# Patient Record
Sex: Female | Born: 1975 | Race: White | Hispanic: No | Marital: Single | Smoking: Never smoker
Health system: Southern US, Community
[De-identification: ages and names within clinical notes are randomized; demographics above are authoritative.]

## PROBLEM LIST (undated history)

## (undated) HISTORY — PX: CHOLECYSTECTOMY: SHX55

## (undated) HISTORY — PX: ABDOMINAL HYSTERECTOMY: SHX81

---

## 2004-12-28 ENCOUNTER — Emergency Department: Payer: Self-pay | Admitting: Emergency Medicine

## 2004-12-28 ENCOUNTER — Other Ambulatory Visit: Payer: Self-pay

## 2005-04-26 ENCOUNTER — Emergency Department: Payer: Self-pay | Admitting: Emergency Medicine

## 2006-01-17 ENCOUNTER — Emergency Department: Payer: Self-pay | Admitting: Emergency Medicine

## 2006-01-18 ENCOUNTER — Ambulatory Visit: Payer: Self-pay | Admitting: Emergency Medicine

## 2006-06-04 ENCOUNTER — Encounter: Admission: RE | Admit: 2006-06-04 | Discharge: 2006-06-04 | Payer: Self-pay | Admitting: Gastroenterology

## 2006-06-05 ENCOUNTER — Encounter: Admission: RE | Admit: 2006-06-05 | Discharge: 2006-06-05 | Payer: Self-pay | Admitting: Gastroenterology

## 2006-12-19 ENCOUNTER — Inpatient Hospital Stay: Payer: Self-pay | Admitting: Surgery

## 2007-08-29 ENCOUNTER — Emergency Department: Payer: Self-pay | Admitting: Emergency Medicine

## 2007-08-30 ENCOUNTER — Other Ambulatory Visit: Payer: Self-pay

## 2007-08-30 ENCOUNTER — Emergency Department: Payer: Self-pay | Admitting: Emergency Medicine

## 2008-01-02 ENCOUNTER — Emergency Department: Payer: Self-pay | Admitting: Emergency Medicine

## 2009-05-20 ENCOUNTER — Emergency Department: Payer: Self-pay | Admitting: Emergency Medicine

## 2009-10-30 ENCOUNTER — Emergency Department: Payer: Self-pay | Admitting: Emergency Medicine

## 2010-07-18 ENCOUNTER — Ambulatory Visit: Payer: Self-pay | Admitting: Gastroenterology

## 2010-11-30 ENCOUNTER — Ambulatory Visit: Payer: Self-pay | Admitting: Obstetrics and Gynecology

## 2010-12-04 LAB — PATHOLOGY REPORT

## 2011-05-25 ENCOUNTER — Ambulatory Visit: Payer: Self-pay | Admitting: Internal Medicine

## 2011-05-25 ENCOUNTER — Emergency Department: Payer: Self-pay | Admitting: Unknown Physician Specialty

## 2011-08-27 ENCOUNTER — Ambulatory Visit: Payer: Self-pay | Admitting: Obstetrics and Gynecology

## 2011-08-27 LAB — BASIC METABOLIC PANEL
BUN: 8 mg/dL (ref 7–18)
Calcium, Total: 8.6 mg/dL (ref 8.5–10.1)
EGFR (African American): >60
EGFR (Non-African Amer.): >60
Glucose: 79 mg/dL (ref 65–99)
Osmolality: 284 (ref 275–301)

## 2011-08-27 LAB — PREGNANCY, URINE: Pregnancy Test, Urine: NEGATIVE m[IU]/mL

## 2011-08-27 LAB — CBC
HGB: 13.4 g/dL (ref 12.0–16.0)
MCV: 94 fL (ref 80–100)
Platelet: 190 10*3/uL (ref 150–440)
RBC: 4.23 10*6/uL (ref 3.80–5.20)
WBC: 8.3 10*3/uL (ref 3.6–11.0)

## 2011-09-02 ENCOUNTER — Ambulatory Visit: Payer: Self-pay | Admitting: Obstetrics and Gynecology

## 2011-09-03 LAB — HEMOGLOBIN: HGB: 11.8 g/dL — ABNORMAL LOW (ref 12.0–16.0)

## 2011-09-04 ENCOUNTER — Emergency Department: Payer: Self-pay

## 2011-09-19 ENCOUNTER — Ambulatory Visit: Payer: Self-pay | Admitting: Obstetrics and Gynecology

## 2011-12-31 ENCOUNTER — Ambulatory Visit: Payer: Self-pay | Admitting: Gastroenterology

## 2012-01-03 ENCOUNTER — Ambulatory Visit: Payer: Self-pay | Admitting: Gastroenterology

## 2012-02-12 IMAGING — CR DG CHEST 2V
1 series · 2 of 2 positions shown · non-contrast
Comparison: none

REASON FOR EXAM: cp
COMMENTS:

PROCEDURE:     DXR - DXR CHEST PA (OR AP) AND LATERAL  - May 25, 2011  [DATE]
RESULT:     The lungs are clear. The cardiac silhouette and visualized bony
skeleton are unremarkable.

[Series 1: w chest pa · 0.14mm/px · 2 of 2 slices shown]
[im 1/2]
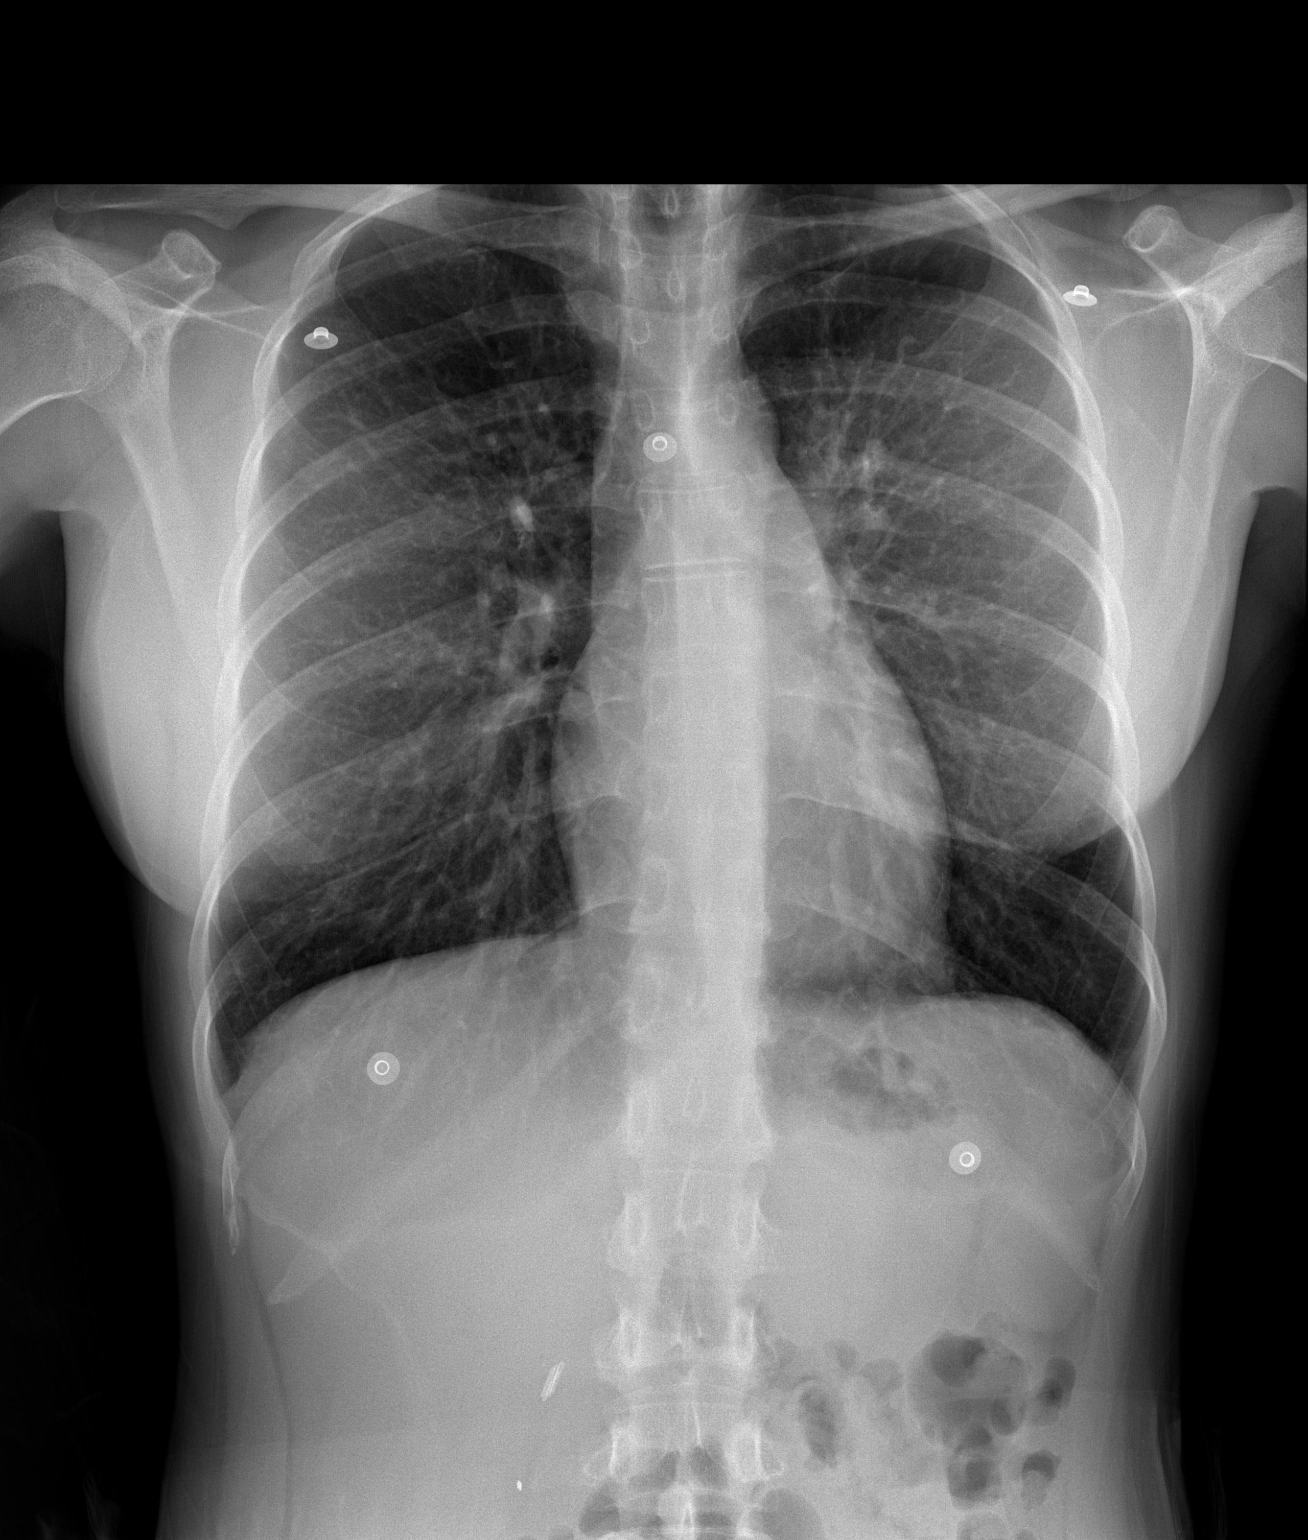
[im 2/2]
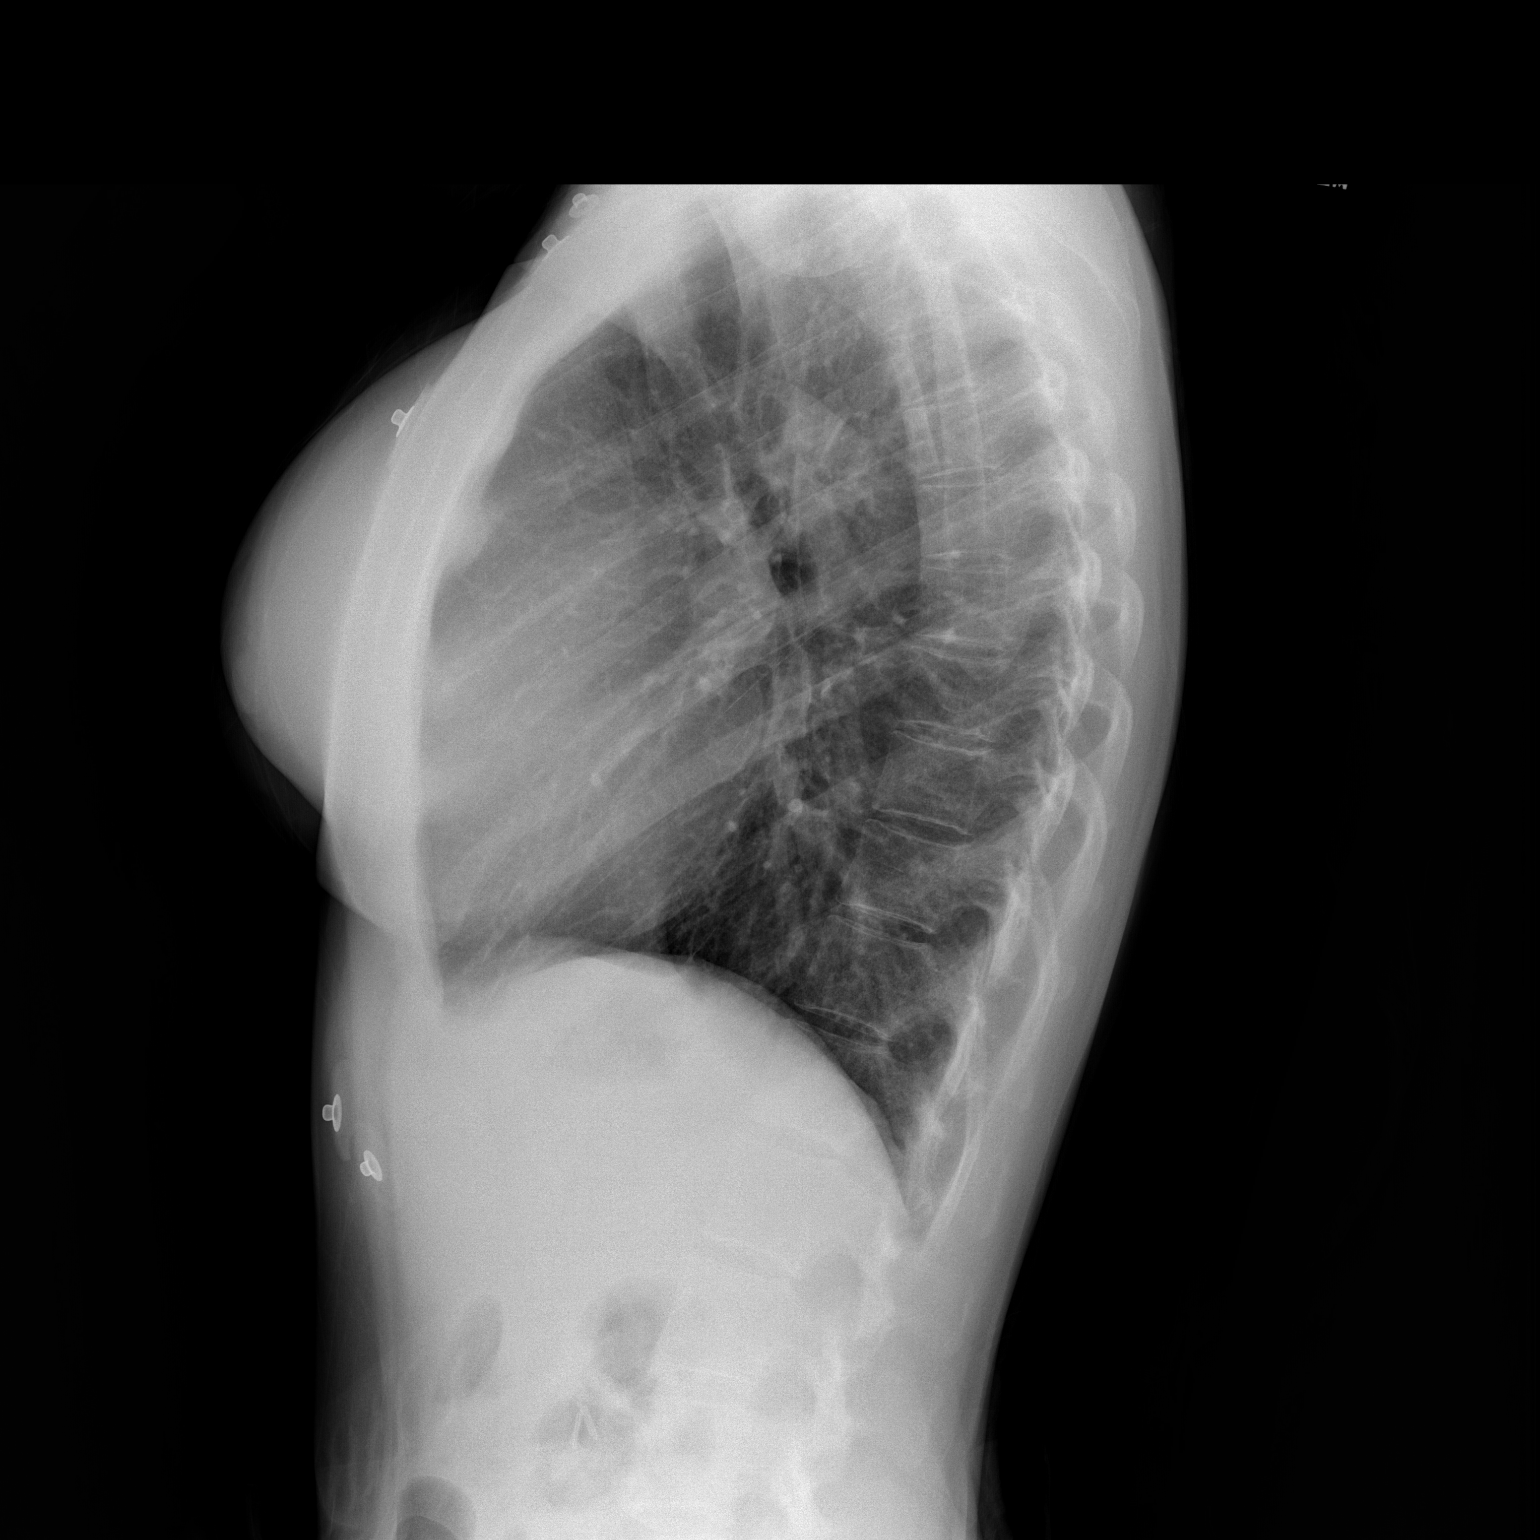

[2 of 2 positions shown; findings below may reference images not displayed]

IMPRESSION: 1. Chest radiograph without evidence of acute cardiopulmonary disease.

## 2012-06-08 IMAGING — CT CT ABDOMEN AND PELVIS WITHOUT AND WITH CONTRAST
3 of 6 series · 12 of 32 positions shown, 17 images · IV contrast (isovue)
Comparison: none

REASON FOR EXAM: Fever, pelvic pain
COMMENTS:

PROCEDURE:      CT ABDOMEN/PELVIS W/WO 09/19/2011 [DATE]
RESULT:
TECHNIQUE: Helical 3 mm sections were obtained from the lung bases through
the pubic symphysis status post administration of 85ml isovue 370
intravenous and oral contrast, delayed contrasted images obtained.

[Series 6: soft tissue · axial · 0.60mm/px · z∈[-966,-706]mm · 4 of 147 slices shown, 9 images (1 of 3)]
[im 30/147  soft-tissue]
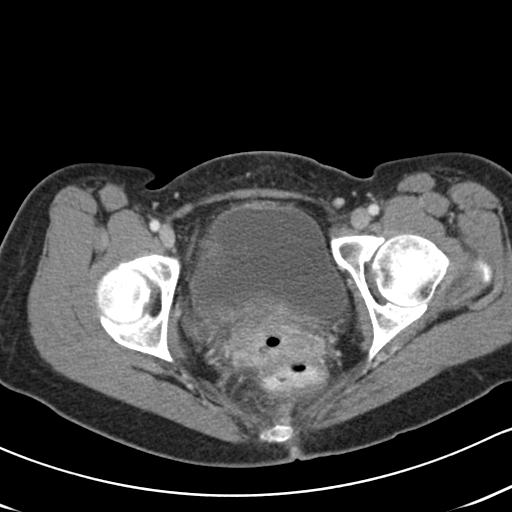
[im 30/147  lung]
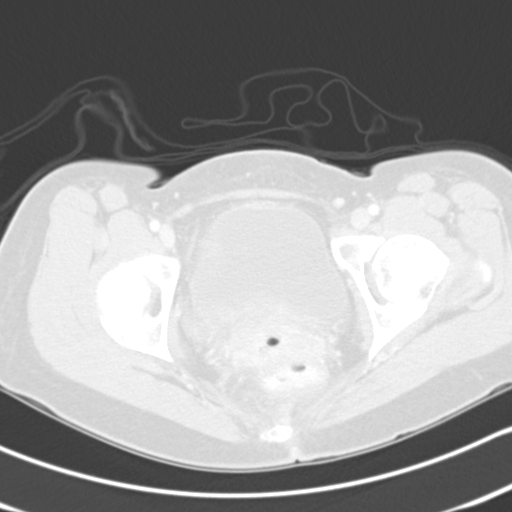
[im 30/147  bone]
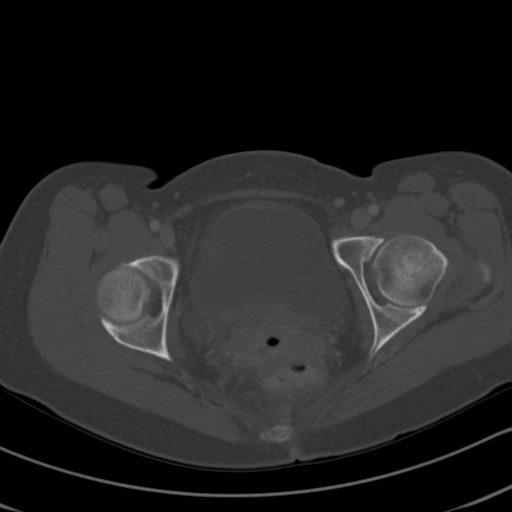
[im 59/147  soft-tissue]
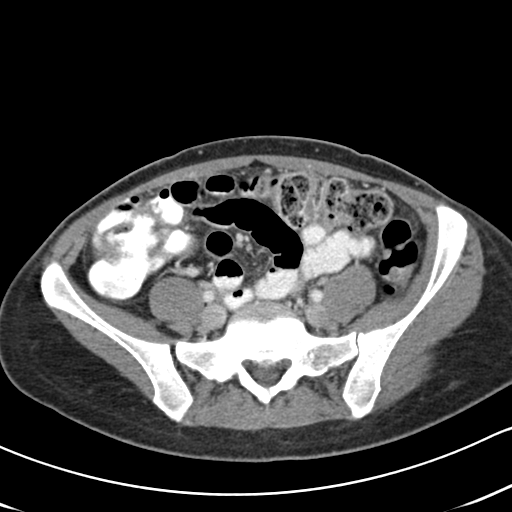
[im 59/147  lung]
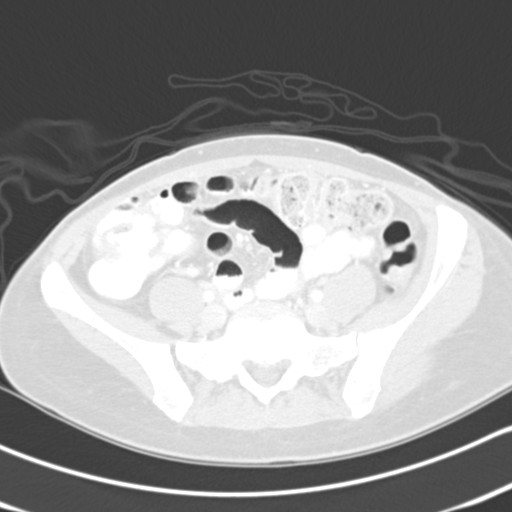
[im 88/147  soft-tissue]
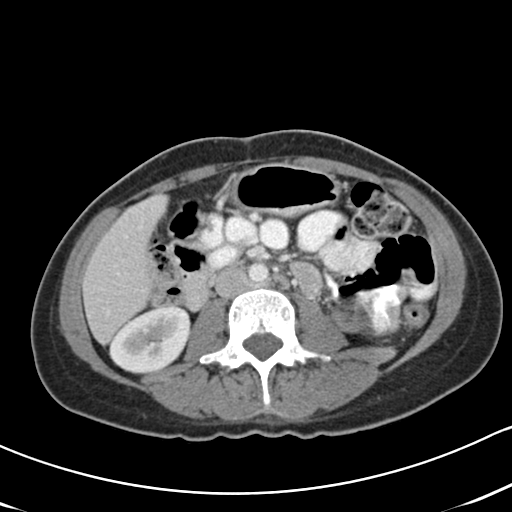
[im 88/147  lung]
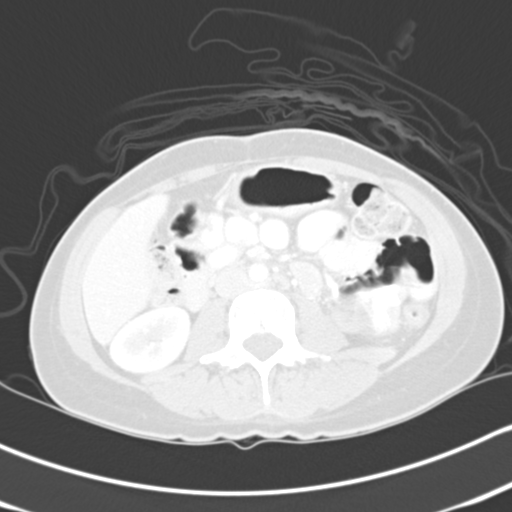
[im 117/147  soft-tissue]
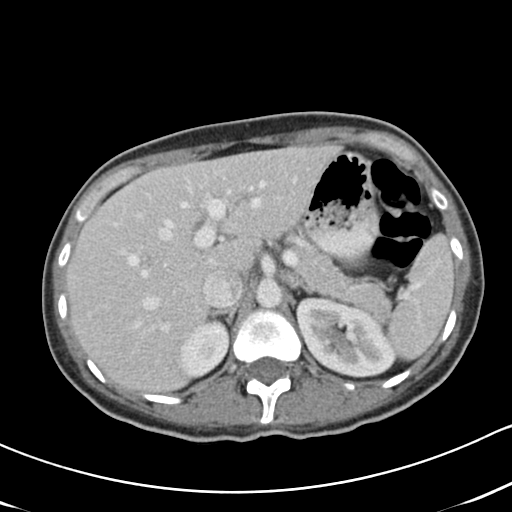
[im 117/147  lung]
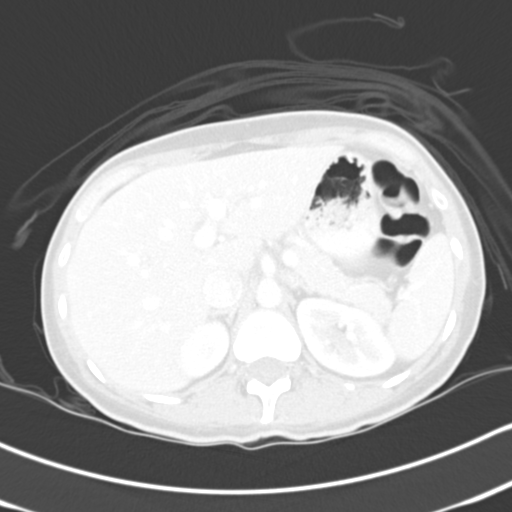

[Series 8: soft tissue · axial · 0.60mm/px · z∈[-966,-706]mm · 4 of 147 slices shown (2 of 3)]
[im 30/147  soft-tissue]
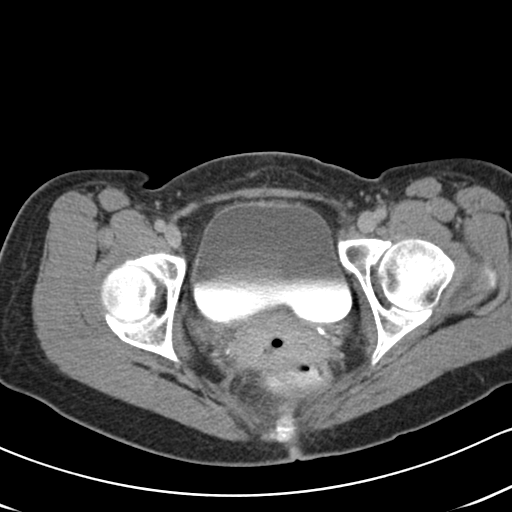
[im 59/147  soft-tissue]
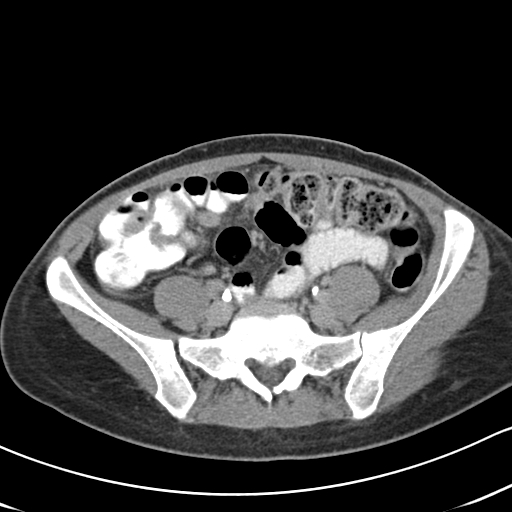
[im 88/147  soft-tissue]
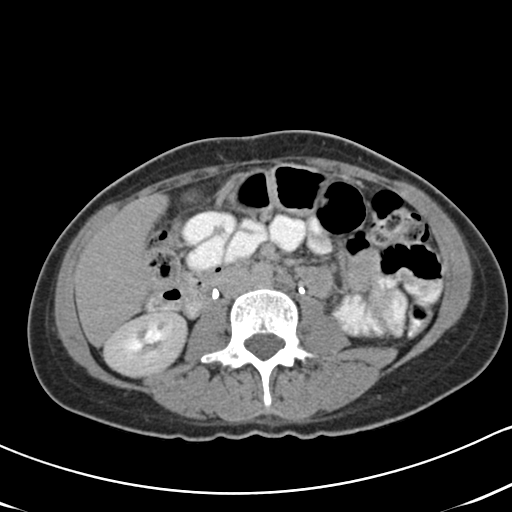
[im 117/147  soft-tissue]
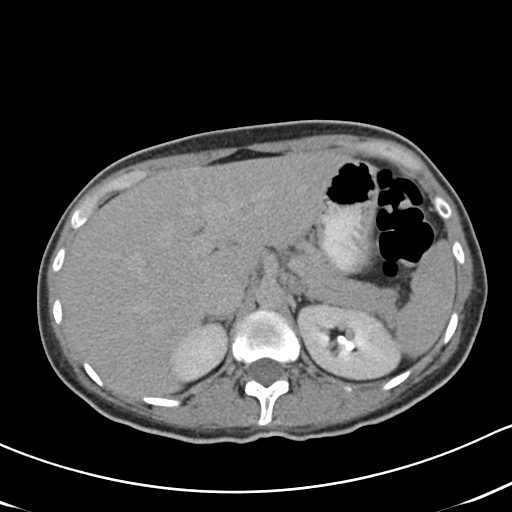

[Series 12: soft tissue · axial · 0.63mm/px · z∈[-1055,-794]mm · 4 of 145 slices shown (3 of 3)]
[im 29/145  soft-tissue]
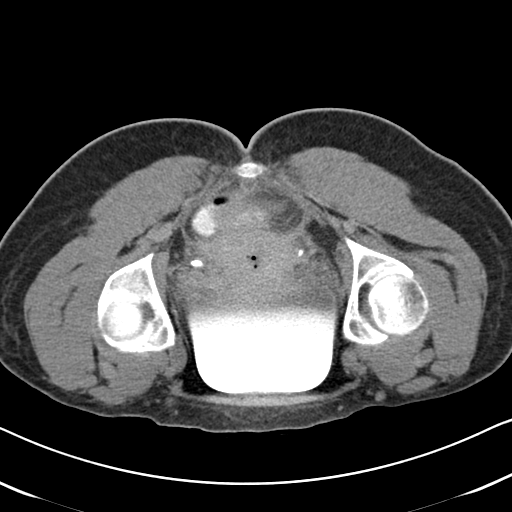
[im 58/145  soft-tissue]
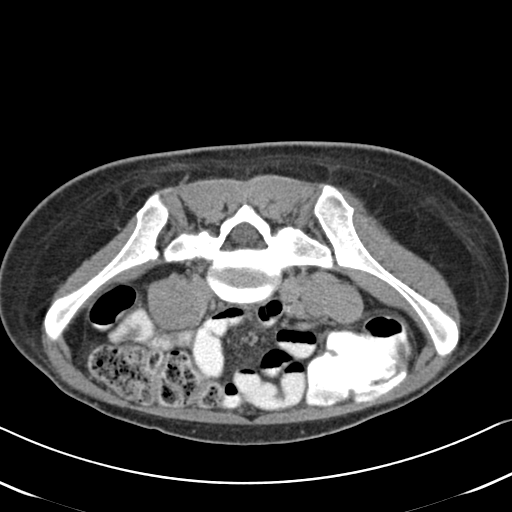
[im 87/145  soft-tissue]
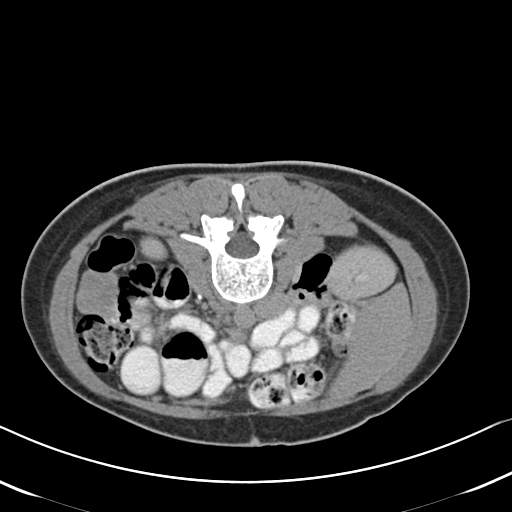
[im 116/145  soft-tissue]
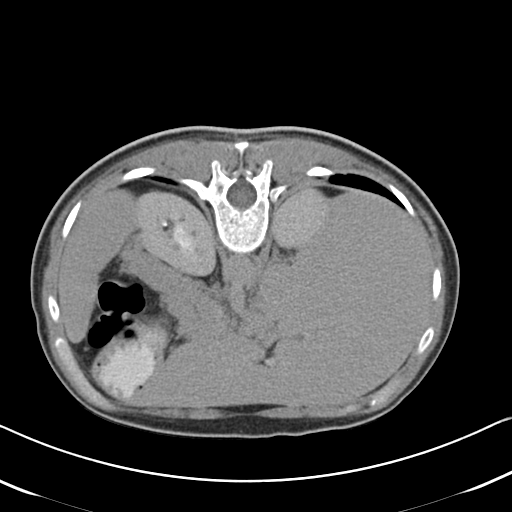

[12 of 32 positions shown; findings below may reference images not displayed]

FINDINGS: The lung bases are unremarkable. Minimal linear areas of increased
density are identified posteriorly likely representing atelectasis.

The liver, spleen, adrenals, pancreas, and kidneys are unremarkable.
Evaluation of the pelvis demonstrates diffuse amorphous intermediate
attenuation throughout the pelvis. Loculated focal area of low attenuation
projects within the posterior right adnexal region measuring 2.81 x 2.18 cm
and a second area within the central lateral portion of the left adnexal
region measuring 2.02 cm. Differential considerations are adnexal ovarian
cysts versus non-ovarian fluid collections. There is dependent
low-attenuating fluid in the cul-de-sac.

Delayed imaging was performed. The ureters are identified inserting into the
urinary bladder without evidence of contrast extravasation. Distended
delayed imaging was performed and the urinary bladder is distended with
contrast without evidence of contrast extravasation nor evidence reflecting
the sequela of a vesicovaginal fistula.

There is no CT evidence of bowel obstruction, aortic aneurysm nor a
dissection. The celiac, SMA, IMA, and SMV are opacified.
IMPRESSION: 1. Amorphous intermediate attenuating density within the pelvis a component
of which likely represents postsurgical change. Hematoma and/or phlegmonous
changes are also of diagnostic consideration.
2. Ovarian cyst versus loculated fluid collection posterior right adnexal
region, similar findings in the mid left adnexa. These areas do not appear
to be amendable to percutaneous drainage due to location.
3. Not mentioned above there are areas consistent with bowel wall thickening
of loops of large and small bowel and a component of reactive colitis and
enteritis are also of diagnostic considerations.
4. These findings were discussed with Dr. Sch of the OB/GYN service
at the time of the initial interpretation.

## 2013-01-04 ENCOUNTER — Ambulatory Visit: Payer: Self-pay

## 2013-01-06 LAB — BETA STREP CULTURE(ARMC)

## 2013-02-14 ENCOUNTER — Ambulatory Visit: Payer: Self-pay | Admitting: Family Medicine

## 2013-02-14 LAB — BASIC METABOLIC PANEL
Anion Gap: 9 (ref 7–16)
BUN: 10 mg/dL (ref 7–18)
Chloride: 107 mmol/L (ref 98–107)
Creatinine: 0.78 mg/dL (ref 0.60–1.30)
Glucose: 94 mg/dL (ref 65–99)
Sodium: 145 mmol/L (ref 136–145)

## 2013-02-14 LAB — CBC WITH DIFFERENTIAL/PLATELET
Basophil #: 0.1 10*3/uL (ref 0.0–0.1)
Eosinophil #: 0.1 10*3/uL (ref 0.0–0.7)
Eosinophil %: 1.5 %
HCT: 41.1 % (ref 35.0–47.0)
Lymphocyte #: 2.5 10*3/uL (ref 1.0–3.6)
MCH: 31.5 pg (ref 26.0–34.0)
MCV: 93 fL (ref 80–100)
Monocyte #: 0.4 x10 3/mm (ref 0.2–0.9)
Neutrophil #: 5.2 10*3/uL (ref 1.4–6.5)
Neutrophil %: 62.7 %
RDW: 13.2 % (ref 11.5–14.5)

## 2013-05-10 ENCOUNTER — Ambulatory Visit: Payer: Self-pay | Admitting: Physician Assistant

## 2013-05-10 LAB — RAPID INFLUENZA A&B ANTIGENS

## 2013-10-23 ENCOUNTER — Ambulatory Visit: Payer: Self-pay | Admitting: Physician Assistant

## 2014-09-13 ENCOUNTER — Ambulatory Visit
Admit: 2014-09-13 | Disposition: A | Discharge status: Home / Self Care | Payer: Self-pay | Attending: Family Medicine | Admitting: Family Medicine

## 2014-09-13 LAB — RAPID STREP-A WITH REFLX: Micro Text Report: NEGATIVE

## 2014-09-13 LAB — RAPID INFLUENZA A&B ANTIGENS (ARMC ONLY)

## 2014-09-16 LAB — BETA STREP CULTURE(ARMC)

## 2014-10-02 NOTE — H&P (Signed)
PATIENT NAME:  Dalbert BatmanWHITFIELD, Philisha L MR#:  [id number] DATE OF BIRTH:  06/27/1975  DATE OF ADMISSION:  09/02/2011  PREOPERATIVE DIAGNOSES: 1. Chronic pelvic pain, incapacitating. 2. Suspected endometriosis/adenomyosis.   HISTORY: Melinda Serrano is a 39 year old single white female, para 1-0-2-1, currently not sexually active, using nothing at this time for contraception but abstinence, presents for transvaginal hysterectomy secondary to long history of chronic pelvic pain. The patient has history of severe dysmenorrhea, deep dyspareunia, and menorrhagia, for which she has been treated with multiple hormonal therapies, including birth control pills of multiple types over the years without regulation. She also had treatment with hysteroscopy and dilatation and curettage in June 2012, with resection of polyps, which gave temporary relief; symptoms have recurred. The patient is desiring to proceed with definitive surgery at this time. She is declining any progestational hormonal agents to try and control symptomatology. The patient reports constant cramping, severe PMS with mood swings, and menorrhagia.   PAST MEDICAL HISTORY:  1. Recurrent UTIs. 2. PMDD. 3. Chronic pelvic pain in the form of dysmenorrhea and deep dyspareunia.   PAST SURGICAL HISTORY:   1. Laparoscopic cholecystectomy. 2. Hysteroscopy with dilatation and curettage. 3. EGD showing gastroparesis.   PAST OB HISTORY:   1. Para 1-0-2-1. Spontaneous vaginal delivery x1, 8 pound 9 ounce female.  2. TAB, uncomplicated. 3. SAB, not requiring dilatation and curettage.   PAST GYN HISTORY: Menarche age 39. Cycles monthly. Duration of flow is 6-7 days. No intermenstrual bleeding or postcoital bleeding. Long history of deep dyspareunia and dysmenorrhea. Most recent Pap negative for dysplasia or HPV. No history of STIs.   FAMILY HISTORY: Colon cancer, maternal aunt. No history of ovarian cancer. Breast cancer in maternal aunt x2.   SOCIAL  HISTORY: The patient is a smoker, having had 18-pack-year smoking history. She is currently smoking 1/4 pack of cigarettes a day. Denies alcohol use. Denies drug use. 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   REVIEW OF SYSTEMS: The patient denies recent illness. She denies history of coagulopathy. She denies history of reactive airways disease.   PHYSICAL EXAMINATION:   VITAL SIGNS: Height 5 feet 1 inch, weight 111, blood pressure 117/75, heart rate 94, BMI 21.   GENERAL: The patient is a pleasant, well-groomed white female, who is alert and oriented. Affect is appropriate.  HEENT: Oropharynx clear.   NECK: Supple. No thyromegaly or adenopathy.  LUNGS: Clear.  HEART:  Regular rate and rhythm without murmur. S3/S4.   ABDOMEN: Soft, flat, nondistended. No hepatosplenomegaly. No pelvic masses. No hernias.   PELVIC EXAM: External genitalia normal. BUS normal. Vagina has good estrogen effect. Cervix is parous without lesions. There is mild cervical motion tenderness, 1/4. The uterus is midplane, anteverted, mobile, and of normal size and shape. It is slightly tender, 1/.4. Adnexal regions are nonpalpable, nontender.   RECTOVAGINAL EXAM: Notable for normal sphincter tone and no rectal masses. There is no posterior uterine nodularity.   EXTREMITIES: Without clubbing, cyanosis, or edema.   SKIN: Without rash.   MUSCULOSKELETAL: Exam normal.   DIAGNOSTIC DATA: Ultrasound report from 08/08/2011, reveals a uterus measuring 7.41 x 5.55 x 4.1 cm, giving a volume of 89.14 cm3. Endometrial stripe was 3.83 mm. Ovaries were seen and notable for a simple follicular cyst on the left measuring 2.1 cm.   IMPRESSION:   1. Chronic pelvic pain with dysmenorrhea, dyspareunia, affecting activities of daily living, refractory to conservative medical therapy. 2. Suspected endometriosis/adenomyosis.   PLAN:  1. Transvaginal  hysterectomy. Date of surgery is 09/02/2011.  2. Consent  note: Ms Doffing is to undergo a transvaginal hysterectomy for management of chronic pelvic pain. She is understanding of the planned procedure and is aware of and is accepting of all surgical risks, which include, but are not limited to, bleeding, infection, pelvic organ injury with need for repair, blood clot disorders, anesthesia risks, and death. All questions are answered. Informed consent is given. The patient is ready and willing to proceed with surgery as scheduled.   ____________________________ Prentice Docker Laura Caldas, MD mad:mln D: 08/27/2011 11:04:55 ET T: 08/27/2011 11:37:03 ET JOB#: [id number]  cc: Daphine Deutscher A. Rifky Lapre, MD, <Dictator> Beryle Quant. Toya Smothers, MD Prentice Docker Gladyse Corvin MD ELECTRONICALLY SIGNED 08/31/2011 11:46

## 2014-10-02 NOTE — Op Note (Signed)
PATIENT NAME:  Melinda Serrano, [name] MR#:  [id number] DATE OF BIRTH:  10/01/1975  DATE OF PROCEDURE:  09/02/2011  PREOPERATIVE DIAGNOSES:  1. Chronic pelvic pain, incapacitating. 2. Suspected endometriosis/adenomyosis.   POSTOPERATIVE DIAGNOSES:  1. Chronic pelvic pain, incapacitating.  2. Suspected endometriosis/adenomyosis.  3. Incidental cystotomy, repaired.   OPERATIVE PROCEDURES:  1. Transvaginal hysterectomy.  2. Repair of incidental cystotomy.  3. Cystoscopy.   SURGEON: Prentice DockerMartin A. Zuleica Seith, MD   FIRST ASSISTANT: Cassell ClementLynde Knowles-Jonas, MD   ANESTHESIA: General endotracheal.   INDICATION: Melinda Serrano is a 39 year old single white female, para 1-0-2-1, who presents for surgical management of longstanding chronic pelvic pain that has been refractory to conservative medical and surgical therapy. The patient has history of severe dysmenorrhea, deep thrust dyspareunia, and menorrhagia. Definitive surgery is requested.   FINDINGS AT SURGERY: Small, mobile uterus. The pelvis was gynecoid. There was normal appearance of the ovaries bilaterally. There was a 1 cm left paratubal cyst noted. During the procedure, there was an incidental cystotomy during anterior cul-de-sac entry which was repaired without difficulty.   DESCRIPTION OF PROCEDURE: The patient was brought to the operating room where she was placed in the supine position. General endotracheal anesthesia was induced without difficulty. She was placed in the dorsal lithotomy position using candy-cane stirrups. A ChloraPrep and Betadine abdominal, perineal, intravaginal prep and drape was performed in the standard fashion. Foley catheter was placed and was draining clear yellow urine from the bladder. A posterior colpotomy was made with Mayo scissors. Uterosacral ligaments were clamped, cut, and stick tied using 0 Vicryl. Cervix was circumscribed and the vaginal mucosa and bladder was dissected off the lower uterine segment.  During the dissection process, entry into the bladder was noted with an incidental cystotomy approximately 2 cm in length being diagnosed. The bladder was further dissected off of the uterus and the anterior cul de sac entered. The cardinal broad ligament complexes were clamped, cut, and stick tied using 0 Vicryl sutures. This was carried out to the level of the uteroovarian ligaments which were then crossclamped, cut, and the uterus was removed from the operative field. The uteroovarian pedicles were then tied with a 0 Vicryl free tie followed by a 0 Vicryl stick tie. These were tagged. The angles of the cystotomy were identified with Allis clamps and they were subsequently tagged with 3-0 Vicryl suture. A two layer closure of the bladder was then performed with 3-0 Vicryl being utilized with the first layer encompassing the bladder mucosa in a simple running manner. The second layer was an imbricating layer involving the bladder muscularis. Once this was accomplished, the Foley catheter previously placed was removed and the bladder was filled with 200 mL of saline mixed with methylene blue. Integrity of the repair site was noted without leakage of dye. The Foley catheter was replaced. The remaining of the vagina was closed with 2-0 chromic sutures in a simple interrupted manner. Uterosacral ligaments were cross tied. Upon completion of the closure of the vagina, cystoscopy was then performed with a 30 degree scope. Findings at the time of cystoscopy included the left and right ureters being patent to indigo carmine dye. The base of the bladder repair appeared intact. No bladder mucosal abnormalities were identified. On completion of cystoscopy, the Foley catheter was replaced and the patient was awakened, mobilized, and taken to the recovery room in satisfactory condition. Estimated blood loss was 100 mL. Complications were none. All instruments, needles, and sponge counts were verified as correct. The patient did  receive Ancef antibiotic prophylaxis prior to entering the operating room.    ____________________________ Prentice Docker Mehlani Blankenburg, MD mad:drc D: 09/03/2011 14:05:56 ET T: 09/03/2011 15:37:18 ET JOB#: [id number]  cc: Daphine Deutscher A. Rogina Schiano, MD, <Dictator> Beryle Quant. Toya Smothers, MD Prentice Docker Dontell Mian MD ELECTRONICALLY SIGNED 09/07/2011 9:19

## 2016-03-05 ENCOUNTER — Emergency Department
Admission: EM | Admit: 2016-03-05 | Discharge: 2016-03-06 | Disposition: A | Discharge status: Home / Self Care | Payer: Worker's Compensation | Attending: Emergency Medicine | Admitting: Emergency Medicine

## 2016-03-05 ENCOUNTER — Encounter: Payer: Self-pay | Admitting: Emergency Medicine

## 2016-03-05 DIAGNOSIS — X500XXA Overexertion from strenuous movement or load, initial encounter: Secondary | ICD-10-CM | POA: Diagnosis not present

## 2016-03-05 DIAGNOSIS — Y9389 Activity, other specified: Secondary | ICD-10-CM | POA: Insufficient documentation

## 2016-03-05 DIAGNOSIS — S39012A Strain of muscle, fascia and tendon of lower back, initial encounter: Secondary | ICD-10-CM | POA: Diagnosis not present

## 2016-03-05 DIAGNOSIS — S3992XA Unspecified injury of lower back, initial encounter: Secondary | ICD-10-CM | POA: Diagnosis present

## 2016-03-05 DIAGNOSIS — Y929 Unspecified place or not applicable: Secondary | ICD-10-CM | POA: Insufficient documentation

## 2016-03-05 DIAGNOSIS — Y999 Unspecified external cause status: Secondary | ICD-10-CM | POA: Insufficient documentation

## 2016-03-05 MED ORDER — DIAZEPAM 5 MG/ML IJ SOLN
5.0000 mg | Freq: Once | INTRAMUSCULAR | Status: AC
  Administered 2016-03-05: 5 mg via INTRAMUSCULAR
  Filled 2016-03-05: qty 2

## 2016-03-05 MED ORDER — NAPROXEN 500 MG PO TABS: 500.0000 mg | ORAL_TABLET | Freq: Two times a day (BID) | ORAL | 0 refills | Status: DC

## 2016-03-05 MED ORDER — METHOCARBAMOL 750 MG PO TABS: 750.0000 mg | ORAL_TABLET | Freq: Four times a day (QID) | ORAL | 0 refills | Status: DC

## 2016-03-05 MED ORDER — HYDROMORPHONE HCL 1 MG/ML IJ SOLN: 1.0000 mg | Freq: Once | INTRAMUSCULAR | Status: DC

## 2016-03-05 MED ORDER — ONDANSETRON 4 MG PO TBDP
4.0000 mg | ORAL_TABLET | Freq: Once | ORAL | Status: AC
  Administered 2016-03-05: 4 mg via ORAL
  Filled 2016-03-05: qty 1

## 2016-03-05 NOTE — ED Triage Notes (Addendum)
Patient ambulatory to triage with steady gait, without difficulty or distress noted; pt reports currently seeing a chiropractor for a back injury; c/o persistent pain unrelieved by meloxicam since Friday; pt st injury at work, did not file workers comp and [facility] [employer] not allow her further care with a orthopedist; pt wanting a referral to such

## 2016-03-05 NOTE — ED Notes (Signed)
Pt ambulatory to room with steady gait. Pt reports she was involved in a work related injury 3 weeks ago. Has been seen at chiropractor for pain management. Pt states the pain has increased and is unable to be managed by current care.

## 2016-03-05 NOTE — ED Provider Notes (Signed)
[facility] Emergency Department Provider Note   ____________________________________________   First MD Initiated Contact with Patient 03/05/16 2315     (approximate)  I have reviewed the triage vital signs and the nursing notes.   HISTORY  Chief Complaint Back Pain    HPI Melinda Serrano is a 40 y.o. female Modena Jansky for evaluation of low back pain secondary to lifting accident 3 weeks ago. Patient states that she's been seen a chiropractor for the last 3 weeks with no relief desires referral to orthopedics. States that she initially lifted furniture overhead and felt twisting sensation in lower back and has been hurting ever since. She describes pain as 10 over 10 but denies any numbness or paresthesia within the groin area.   History reviewed. No pertinent past medical history.  There are no active problems to display for this patient.   Past Surgical History:  Procedure Laterality Date  . ABDOMINAL HYSTERECTOMY    . CHOLECYSTECTOMY      Prior to Admission medications   Medication Sig Start Date End Date Taking? Authorizing Provider  methocarbamol (ROBAXIN) 750 MG tablet Take 1 tablet (750 mg total) by mouth 4 (four) times daily. 03/05/16   Evangeline Dakin, PA-C  naproxen (NAPROSYN) 500 MG tablet Take 1 tablet (500 mg total) by mouth 2 (two) times daily with a meal. 03/05/16   Evangeline Dakin, PA-C    Allergies Review of patient's allergies indicates no known allergies.  No family history on file.  Social History Social History  Substance Use Topics  . Smoking status: Never Smoker  . Smokeless tobacco: Never Used  . Alcohol use No    Review of Systems Constitutional: No fever/chills Gastrointestinal: No abdominal pain.  No nausea, no vomiting.  No diarrhea.  No constipation. Genitourinary: Negative for dysuria. Musculoskeletal: Positive for low back pain. Skin: Negative for rash. Neurological: Negative for headaches, focal  weakness or numbness.  10-point ROS otherwise negative.  ____________________________________________   PHYSICAL EXAM:  VITAL SIGNS: ED Triage Vitals  Enc Vitals Group     BP 03/05/16 2153 131/82     Pulse Rate 03/05/16 2153 (!) 103     Resp 03/05/16 2153 18     Temp 03/05/16 2153 98 F (36.7 C)     Temp Source 03/05/16 2153 Oral     SpO2 03/05/16 2153 98 %     Weight 03/05/16 2150 120 lb (54.4 kg)     Height 03/05/16 2150 5\' 1"  (1.549 m)     Head Circumference --      Peak Flow --      Pain Score 03/05/16 2152 10     Pain Loc --      Pain Edu? --      Excl. in GC? --     Constitutional: Alert and oriented. Well appearing and in no acute distress. Gastrointestinal: Soft and nontender. No distention. No abdominal bruits. No CVA tenderness. Musculoskeletal: Straight leg raise negative point tenderness noted to the lumbar spine only. Mainly in the paraspinal muscle areas. No pelvic tenderness or hip tenderness with rock. Neurologic:  Normal speech and language. No gross focal neurologic deficits are appreciated. No gait instability. Skin:  Skin is warm, dry and intact. No rash noted. Psychiatric: Mood and affect are normal. Speech and behavior are normal.  ____________________________________________   LABS (all labs ordered are listed, but only abnormal results are displayed)  Labs Reviewed - No data to display ____________________________________________  EKG  ____________________________________________  RADIOLOGY   ____________________________________________   PROCEDURES  Procedure(s) performed: None  Procedures  Critical Care performed: No  ____________________________________________   INITIAL IMPRESSION / ASSESSMENT AND PLAN / ED COURSE  Pertinent labs & imaging results that were available during my care of the patient were reviewed by me and considered in my medical decision making (see chart for details).  Acute lumbosacral strain. Rx  given for Robaxin 750 mg 4 times a day and Naprosyn 500 mg twice a day. She is to follow-up with orthopedics on-call and return to the ER with any worsening symptomology.  Clinical Course     ____________________________________________   FINAL CLINICAL IMPRESSION(S) / ED DIAGNOSES  Final diagnoses:  Lumbar strain, initial encounter      NEW MEDICATIONS STARTED DURING THIS VISIT:  New Prescriptions   METHOCARBAMOL (ROBAXIN) 750 MG TABLET    Take 1 tablet (750 mg total) by mouth 4 (four) times daily.   NAPROXEN (NAPROSYN) 500 MG TABLET    Take 1 tablet (500 mg total) by mouth 2 (two) times daily with a meal.     Note:  This document was prepared using Dragon voice recognition software and may include unintentional dictation errors.   Evangeline Dakinharles M Beers, PA-C 03/05/16 2352    Rockne MenghiniAnne-Caroline Norman, MD 03/06/16 1759

## 2016-08-23 ENCOUNTER — Other Ambulatory Visit: Payer: Self-pay | Admitting: Family Medicine

## 2016-08-23 DIAGNOSIS — Z1231 Encounter for screening mammogram for malignant neoplasm of breast: Secondary | ICD-10-CM

## 2016-09-26 ENCOUNTER — Ambulatory Visit: Payer: Self-pay | Attending: Family Medicine

## 2021-09-12 ENCOUNTER — Ambulatory Visit
Admission: EM | Admit: 2021-09-12 | Discharge: 2021-09-12 | Disposition: A | Discharge status: Home / Self Care | Payer: Self-pay

## 2021-09-12 ENCOUNTER — Other Ambulatory Visit: Payer: Self-pay

## 2021-09-12 ENCOUNTER — Encounter: Payer: Self-pay | Admitting: Emergency Medicine

## 2021-09-12 DIAGNOSIS — J019 Acute sinusitis, unspecified: Secondary | ICD-10-CM

## 2021-09-12 DIAGNOSIS — R051 Acute cough: Secondary | ICD-10-CM

## 2021-09-12 DIAGNOSIS — R490 Dysphonia: Secondary | ICD-10-CM

## 2021-09-12 DIAGNOSIS — J209 Acute bronchitis, unspecified: Secondary | ICD-10-CM

## 2021-09-12 MED ORDER — LIDOCAINE VISCOUS HCL 2 % MT SOLN: 15.0000 mL | OROMUCOSAL | 0 refills | Status: DC | PRN

## 2021-09-12 MED ORDER — PROMETHAZINE-DM 6.25-15 MG/5ML PO SYRP: 5.0000 mL | ORAL_SOLUTION | Freq: Four times a day (QID) | ORAL | 0 refills | Status: DC | PRN

## 2021-09-12 MED ORDER — LIDOCAINE VISCOUS HCL 2 % MT SOLN: 15.0000 mL | OROMUCOSAL | 0 refills | Status: AC | PRN

## 2021-09-12 MED ORDER — PROMETHAZINE-DM 6.25-15 MG/5ML PO SYRP: 5.0000 mL | ORAL_SOLUTION | Freq: Four times a day (QID) | ORAL | 0 refills | Status: AC | PRN

## 2021-09-12 MED ORDER — AMOXICILLIN 500 MG PO CAPS: 1000.0000 mg | ORAL_CAPSULE | Freq: Three times a day (TID) | ORAL | 0 refills | Status: DC

## 2021-09-12 MED ORDER — AMOXICILLIN 500 MG PO CAPS: 1000.0000 mg | ORAL_CAPSULE | Freq: Three times a day (TID) | ORAL | 0 refills | Status: AC

## 2021-09-12 NOTE — ED Provider Notes (Signed)
?[facility] ? ? ? ?CSN: [id number] ?Arrival date & time: 09/12/21  1330 ? ? ?  ? ?History   ?Chief Complaint ?Chief Complaint  ?Patient presents with  ? chest congestion  ? Cough  ? ? ?HPI ?Melinda Serrano is a 46 y.o. female presenting for 2-week history of nasal congestion, sinus pressure, fatigue, postnasal drainage, burning sensation in throat and chest and cough that is occasionally productive of discolored sputum.  Patient reports symptoms are worsening and not improving.  She says the sinus congestion has gotten a little better but overall she feels worse.  She does report temps up to 101 degrees at night over the past 2-3 nights.  Denies associated breathing difficulty or wheezing.  Patient reports history of COVID-19 2 months ago.  Patient has tried multiple over-the-counter medications for symptoms without improvement.  No other complaints. ? ?HPI ? ?History reviewed. No pertinent past medical history. ? ?There are no problems to display for this patient. ? ? ?Past Surgical History:  ?Procedure Laterality Date  ? ABDOMINAL HYSTERECTOMY    ? CHOLECYSTECTOMY    ? ? ?OB History   ?No obstetric history on file. ?  ? ? ? ?Home Medications   ? ?Prior to Admission medications   ?Medication Sig Start Date End Date Taking? Authorizing Provider  ?amoxicillin (AMOXIL) 500 MG capsule Take 2 capsules (1,000 mg total) by mouth in the morning, at noon, and at bedtime for 7 days. 09/12/21 09/19/21  Laurene Footman B, PA-C  ?lidocaine (XYLOCAINE) 2 % solution Use as directed 15 mLs in the mouth or throat every 3 (three) hours as needed for mouth pain (swish and spit). 09/12/21   Danton Clap, PA-C  ?methylphenidate 27 MG PO CR tablet Take 27 mg by mouth every morning. 08/10/21   [provider]  ?promethazine-dextromethorphan (PROMETHAZINE-DM) 6.25-15 MG/5ML syrup Take 5 mLs by mouth 4 (four) times daily as needed. 09/12/21   Danton Clap, PA-C  ? ? ?Family History ?History reviewed. No pertinent  family history. ? ?Social History ?Social History  ? ?Tobacco Use  ? Smoking status: Never  ? Smokeless tobacco: Never  ?Substance Use Topics  ? Alcohol use: No  ? ? ? ?Allergies   ?Patient has no known allergies. ? ? ?Review of Systems ?Review of Systems  ?Constitutional:  Positive for fatigue. Negative for chills, diaphoresis and fever.  ?HENT:  Positive for congestion, ear pain, postnasal drip, rhinorrhea, sinus pressure and sore throat. Negative for sinus pain.   ?Respiratory:  Positive for cough. Negative for chest tightness, shortness of breath and wheezing.   ?Cardiovascular:  Negative for chest pain.  ?Gastrointestinal:  Negative for abdominal pain, nausea and vomiting.  ?Musculoskeletal:  Negative for arthralgias and myalgias.  ?Skin:  Negative for rash.  ?Neurological:  Negative for weakness and headaches.  ?Hematological:  Negative for adenopathy.  ? ? ?Physical Exam ?Triage Vital Signs ?ED Triage Vitals  ?Enc Vitals Group  ?   BP   ?   Pulse   ?   Resp   ?   Temp   ?   Temp src   ?   SpO2   ?   Weight   ?   Height   ?   Head Circumference   ?   Peak Flow   ?   Pain Score   ?   Pain Loc   ?   Pain Edu?   ?   Excl. in [place]?   ? ?  No data found. ? ?Updated Vital Signs ?BP (!) 150/94 (BP Location: Left Arm)   Pulse (!) 111   Temp 98.4 ?F (36.9 ?C) (Oral)   Resp 20   Ht 5\' 1"  (1.549 m)   Wt 124 lb (56.2 kg)   SpO2 100%   BMI 23.43 kg/m?  ?    ? ?Physical Exam ?Vitals and nursing note reviewed.  ?Constitutional:   ?   General: She is not in acute distress. ?   Appearance: Normal appearance. She is ill-appearing. She is not toxic-appearing.  ?HENT:  ?   Head: Normocephalic and atraumatic.  ?   Right Ear: Ear canal and external ear normal. A middle ear effusion is present.  ?   Left Ear: Ear canal and external ear normal. A middle ear effusion is present. Tympanic membrane is injected.  ?   Nose: Mucosal edema and congestion present.  ?   Mouth/Throat:  ?   Mouth: Mucous membranes are moist.  ?   Pharynx:  Oropharynx is clear. Posterior oropharyngeal erythema present.  ?Eyes:  ?   General: No scleral icterus.    ?   Right eye: No discharge.     ?   Left eye: No discharge.  ?   Conjunctiva/sclera: Conjunctivae normal.  ?Cardiovascular:  ?   Rate and Rhythm: Regular rhythm. Tachycardia present.  ?   Heart sounds: Normal heart sounds.  ?Pulmonary:  ?   Effort: Pulmonary effort is normal. No respiratory distress.  ?   Breath sounds: Normal breath sounds. No wheezing, rhonchi or rales.  ?Musculoskeletal:  ?   Cervical back: Neck supple.  ?Skin: ?   General: Skin is dry.  ?Neurological:  ?   General: No focal deficit present.  ?   Mental Status: She is alert. Mental status is at baseline.  ?   Motor: No weakness.  ?   Gait: Gait normal.  ?Psychiatric:     ?   Mood and Affect: Mood normal.     ?   Behavior: Behavior normal.     ?   Thought Content: Thought content normal.  ? ? ? ?UC Treatments / Results  ?Labs ?(all labs ordered are listed, but only abnormal results are displayed) ?Labs Reviewed - No data to display ? ?EKG ? ? ?Radiology ?No results found. ? ?Procedures ?Procedures (including critical care time) ? ?Medications Ordered in UC ?Medications - No data to display ? ?Initial Impression / Assessment and Plan / UC Course  ?I have reviewed the triage vital signs and the nursing notes. ? ?Pertinent labs & imaging results that were available during my care of the patient were reviewed by me and considered in my medical decision making (see chart for details). ? ?46 year old female presenting for 2-week history of nasal congestion, sinus pressure, postnasal drainage, burning sensation in throat and chest as well as cough and fatigue.  Symptoms are worsening and not improving.  Oxygen is on #patient is afebrile.  She is tachycardic.  On exam she is ill-appearing with a hoarse voice but nontoxic.  Nasal congestion and mucosal edema, effusion of bilateral TMs with injection of the left TM, mild postpharyngeal erythema with  clear postnasal drainage.  Chest clear to auscultation but patient is having a difficult time taking a deep breath without coughing.  Will cover patient at this time for suspected secondary bacterial sinusitis/bronchitis.  Sent amoxicillin to pharmacy as well as Promethazine DM and viscous lidocaine.  Encouraged increasing rest and fluids.  Reviewed  Tylenol for fever but if fever is not breaking the next couple of days she should return for reevaluation and imaging.  Holding off on imaging at this time since her oxygen is normal, she is afebrile and chest is clear. ? ? ?Final Clinical Impressions(s) / UC Diagnoses  ? ?Final diagnoses:  ?Acute sinusitis, recurrence not specified, unspecified location  ?Acute bronchitis, unspecified organism  ?Acute cough  ?Voice hoarseness  ? ? ? ?Discharge Instructions   ? ?  ?-Symptoms consistent with sinus infection and bronchitis.  Have sent antibiotics to pharmacy as well as a cough medicine and lidocaine for your throat.  You can also use Chloraseptic spray and throat lozenges.  Increase rest and fluids. ?- If fevers are not breaking the next couple days or symptoms worsen including increased chest burning/pain or breathing difficulty, you need to be reevaluated and have a chest x-ray. ? ? ? ? ?ED Prescriptions   ? ? Medication Sig Dispense Auth. Provider  ? amoxicillin (AMOXIL) 500 MG capsule  (Status: Discontinued) Take 2 capsules (1,000 mg total) by mouth in the morning, at noon, and at bedtime for 7 days. 42 capsule Laurene Footman B, PA-C  ? promethazine-dextromethorphan (PROMETHAZINE-DM) 6.25-15 MG/5ML syrup  (Status: Discontinued) Take 5 mLs by mouth 4 (four) times daily as needed. 118 mL Laurene Footman B, PA-C  ? lidocaine (XYLOCAINE) 2 % solution  (Status: Discontinued) Use as directed 15 mLs in the mouth or throat every 3 (three) hours as needed for mouth pain (swish and spit). 100 mL Laurene Footman B, PA-C  ? amoxicillin (AMOXIL) 500 MG capsule Take 2 capsules (1,000 mg  total) by mouth in the morning, at noon, and at bedtime for 7 days. 42 capsule Laurene Footman B, PA-C  ? lidocaine (XYLOCAINE) 2 % solution Use as directed 15 mLs in the mouth or throat every 3 (three) hours as n

## 2021-09-12 NOTE — Discharge Instructions (Addendum)
-  Symptoms consistent with sinus infection and bronchitis.  Have sent antibiotics to pharmacy as well as a cough medicine and lidocaine for your throat.  You can also use Chloraseptic spray and throat lozenges.  Increase rest and fluids. ?- If fevers are not breaking the next couple days or symptoms worsen including increased chest burning/pain or breathing difficulty, you need to be reevaluated and have a chest x-ray. ?

## 2021-09-12 NOTE — ED Triage Notes (Signed)
Pt c/o cough, chest congestion that started two weeks ago. Reports burning in her chest, ear pain, productive cough, sore throat. Reports was having a lot of sinus congestion but that has improved. Some fevers at home, but none today.  ?

## 2022-05-10 DIAGNOSIS — Z419 Encounter for procedure for purposes other than remedying health state, unspecified: Secondary | ICD-10-CM | POA: Diagnosis not present

## 2022-06-10 DIAGNOSIS — Z419 Encounter for procedure for purposes other than remedying health state, unspecified: Secondary | ICD-10-CM | POA: Diagnosis not present

## 2022-09-09 DIAGNOSIS — Z419 Encounter for procedure for purposes other than remedying health state, unspecified: Secondary | ICD-10-CM | POA: Diagnosis not present

## 2022-10-09 DIAGNOSIS — Z419 Encounter for procedure for purposes other than remedying health state, unspecified: Secondary | ICD-10-CM | POA: Diagnosis not present

## 2022-11-09 DIAGNOSIS — Z419 Encounter for procedure for purposes other than remedying health state, unspecified: Secondary | ICD-10-CM | POA: Diagnosis not present

## 2022-12-09 DIAGNOSIS — Z419 Encounter for procedure for purposes other than remedying health state, unspecified: Secondary | ICD-10-CM | POA: Diagnosis not present

## 2023-01-09 DIAGNOSIS — Z419 Encounter for procedure for purposes other than remedying health state, unspecified: Secondary | ICD-10-CM | POA: Diagnosis not present

## 2023-02-09 DIAGNOSIS — Z419 Encounter for procedure for purposes other than remedying health state, unspecified: Secondary | ICD-10-CM | POA: Diagnosis not present

## 2023-03-11 DIAGNOSIS — Z419 Encounter for procedure for purposes other than remedying health state, unspecified: Secondary | ICD-10-CM | POA: Diagnosis not present

## 2023-04-11 DIAGNOSIS — Z419 Encounter for procedure for purposes other than remedying health state, unspecified: Secondary | ICD-10-CM | POA: Diagnosis not present

## 2023-05-11 DIAGNOSIS — Z419 Encounter for procedure for purposes other than remedying health state, unspecified: Secondary | ICD-10-CM | POA: Diagnosis not present

## 2023-06-11 DIAGNOSIS — Z419 Encounter for procedure for purposes other than remedying health state, unspecified: Secondary | ICD-10-CM | POA: Diagnosis not present

## 2023-07-12 DIAGNOSIS — Z419 Encounter for procedure for purposes other than remedying health state, unspecified: Secondary | ICD-10-CM | POA: Diagnosis not present

## 2023-08-09 DIAGNOSIS — Z419 Encounter for procedure for purposes other than remedying health state, unspecified: Secondary | ICD-10-CM | POA: Diagnosis not present

## 2023-09-20 DIAGNOSIS — Z419 Encounter for procedure for purposes other than remedying health state, unspecified: Secondary | ICD-10-CM | POA: Diagnosis not present
